# Patient Record
Sex: Female | Born: 1940 | Race: White | Hispanic: No | Smoking: Former smoker
Health system: Southern US, Community
[De-identification: ages and names within clinical notes are randomized; demographics above are authoritative.]

## PROBLEM LIST (undated history)

## (undated) DIAGNOSIS — I10 Essential (primary) hypertension: Secondary | ICD-10-CM

## (undated) DIAGNOSIS — C3490 Malignant neoplasm of unspecified part of unspecified bronchus or lung: Secondary | ICD-10-CM

## (undated) HISTORY — PX: BREAST BIOPSY: SHX20

---

## 2005-03-12 ENCOUNTER — Ambulatory Visit: Payer: Self-pay | Admitting: Family Medicine

## 2006-03-18 ENCOUNTER — Ambulatory Visit: Payer: Self-pay | Admitting: Unknown Physician Specialty

## 2007-04-05 ENCOUNTER — Ambulatory Visit: Payer: Self-pay | Admitting: Unknown Physician Specialty

## 2008-04-06 ENCOUNTER — Ambulatory Visit: Payer: Self-pay | Admitting: Unknown Physician Specialty

## 2009-04-17 ENCOUNTER — Ambulatory Visit: Payer: Self-pay | Admitting: Unknown Physician Specialty

## 2010-05-09 ENCOUNTER — Ambulatory Visit: Payer: Self-pay | Admitting: Unknown Physician Specialty

## 2011-04-23 ENCOUNTER — Ambulatory Visit: Payer: Self-pay | Admitting: Unknown Physician Specialty

## 2011-06-25 ENCOUNTER — Ambulatory Visit: Payer: Self-pay | Admitting: Unknown Physician Specialty

## 2012-04-13 ENCOUNTER — Ambulatory Visit: Payer: Self-pay | Admitting: Family Medicine

## 2012-05-19 ENCOUNTER — Ambulatory Visit: Payer: Self-pay | Admitting: Unknown Physician Specialty

## 2012-06-28 ENCOUNTER — Ambulatory Visit: Payer: Self-pay | Admitting: Unknown Physician Specialty

## 2012-09-26 ENCOUNTER — Ambulatory Visit: Payer: Self-pay

## 2012-10-28 ENCOUNTER — Ambulatory Visit: Payer: Self-pay | Admitting: Family Medicine

## 2013-06-29 ENCOUNTER — Ambulatory Visit: Payer: Self-pay | Admitting: Unknown Physician Specialty

## 2014-07-05 ENCOUNTER — Ambulatory Visit: Payer: Self-pay | Admitting: Unknown Physician Specialty

## 2014-08-26 IMAGING — CT CT ABD-PELV W/ CM
1 of 3 series · 14 of 32 positions shown, 19 images · IV contrast (isovue)
Comparison: None

REASON FOR EXAM: ADD ON LABS 1st  CALL REPORT [id number] abd pain
possible diverticulitis D...
COMMENTS:

PROCEDURE:     CT  - CT ABDOMEN / PELVIS  W  - September 26, 2012 [DATE]
RESULT:     History: Abdominal pain
TECHNIQUE: Multiple axial images of the abdomen and pelvis were performed
from the lung bases to the pubic symphysis, with p.o. contrast and with 100
ml of Isovue 300 intravenous contrast.

[Series 2: 3mm soft tissue · axial · 0.81mm/px · z∈[-248,+151]mm · 14 of 149 slices shown, 19 images]
[im 8/149  soft-tissue]
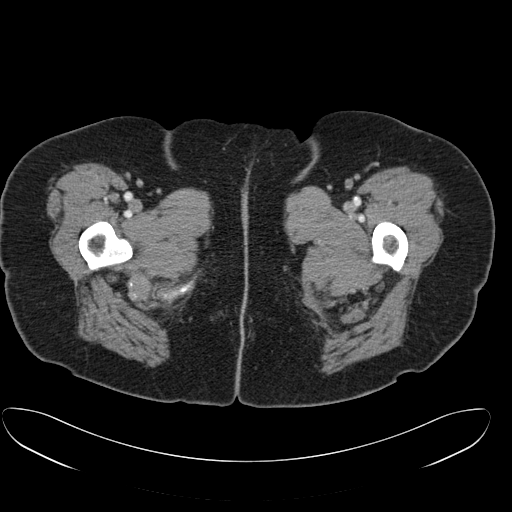
[im 8/149  bone]
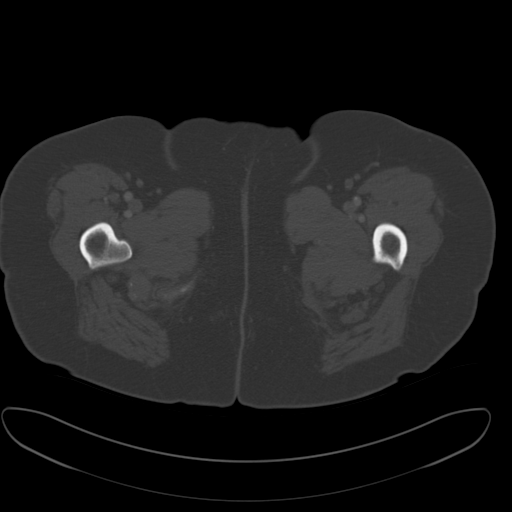
[im 23/149  soft-tissue]
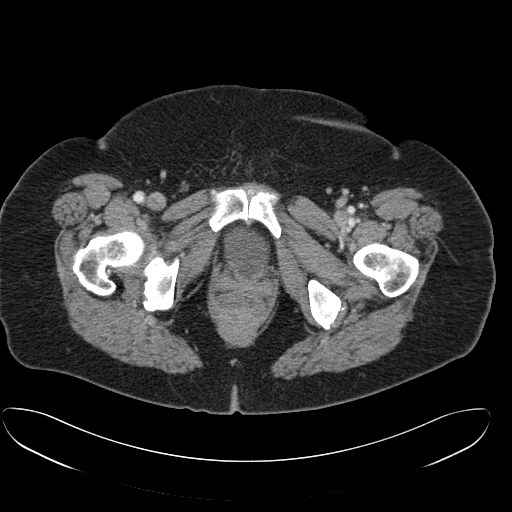
[im 30/149  soft-tissue]
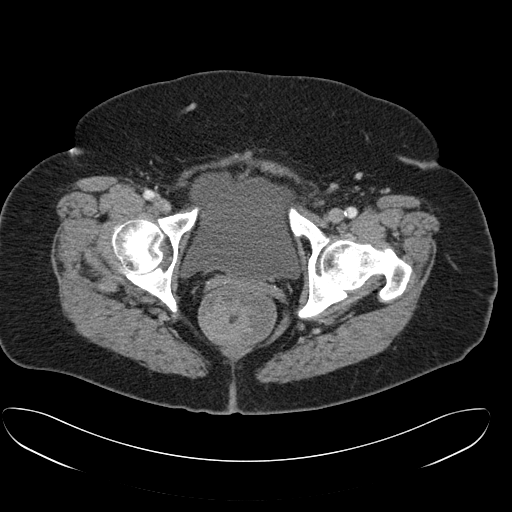
[im 45/149  soft-tissue]
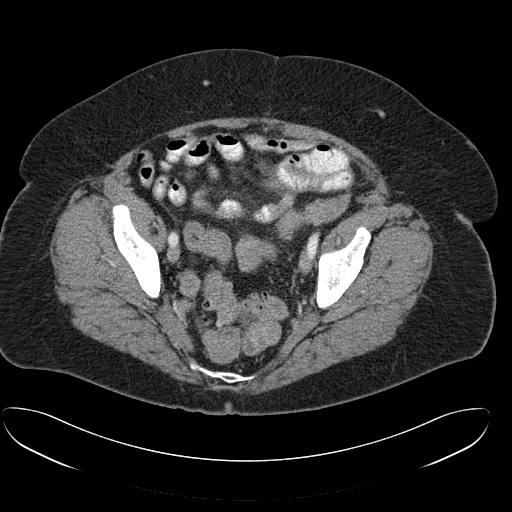
[im 52/149  soft-tissue]
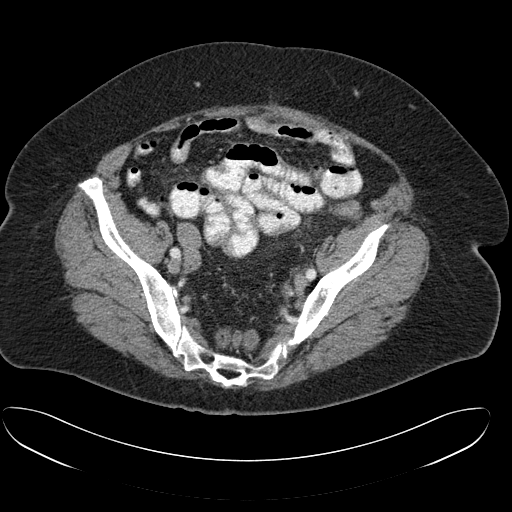
[im 67/149  soft-tissue]
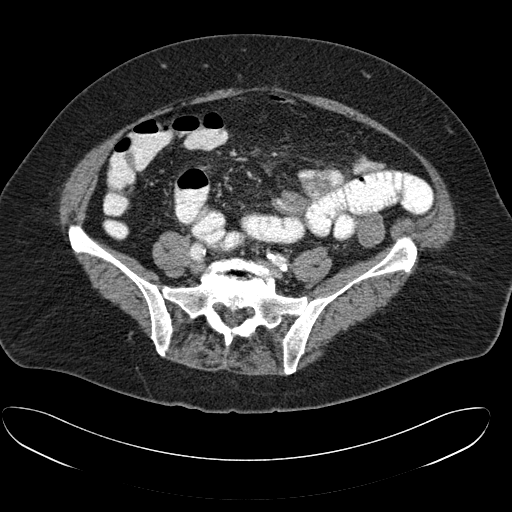
[im 75/149  soft-tissue]
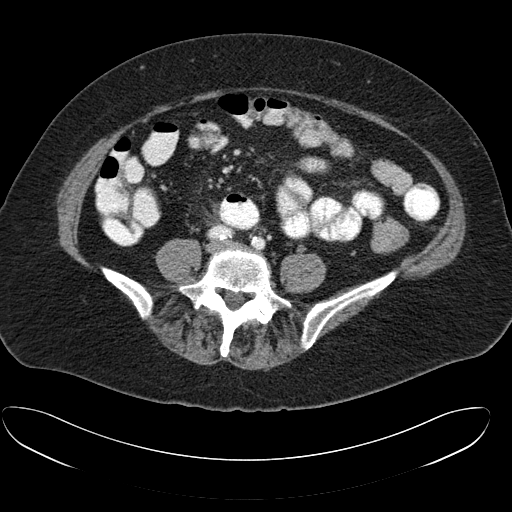
[im 82/149  soft-tissue]
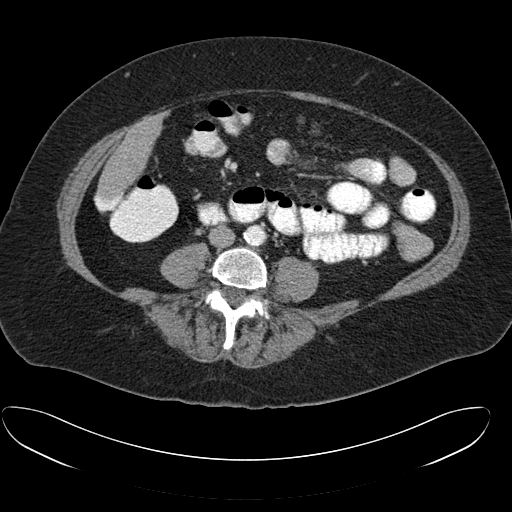
[im 97/149  soft-tissue]
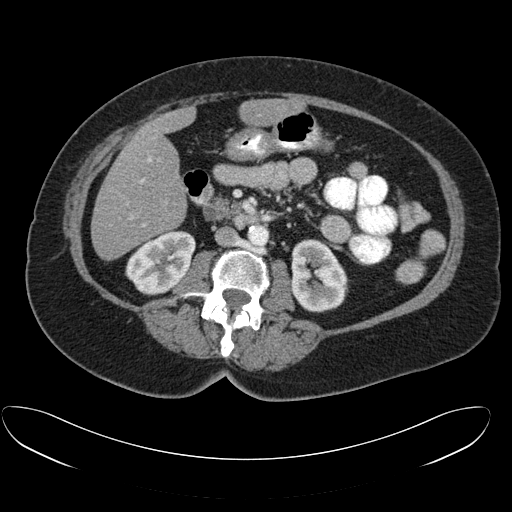
[im 97/149  bone]
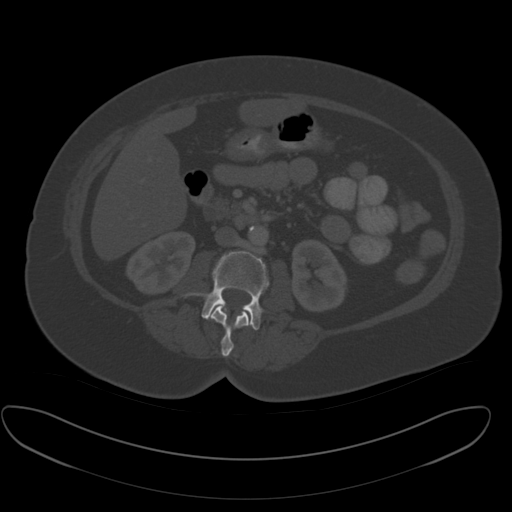
[im 104/149  soft-tissue]
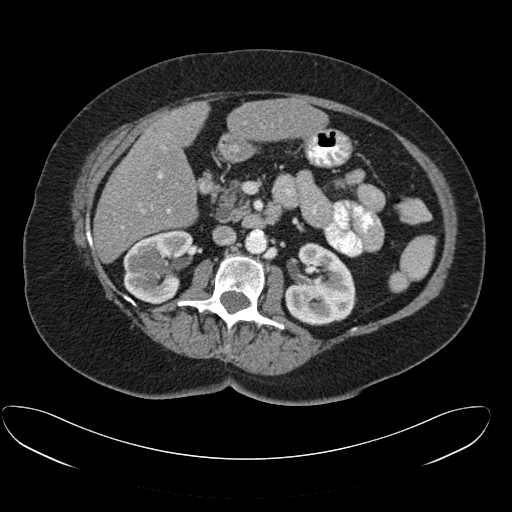
[im 119/149  soft-tissue]
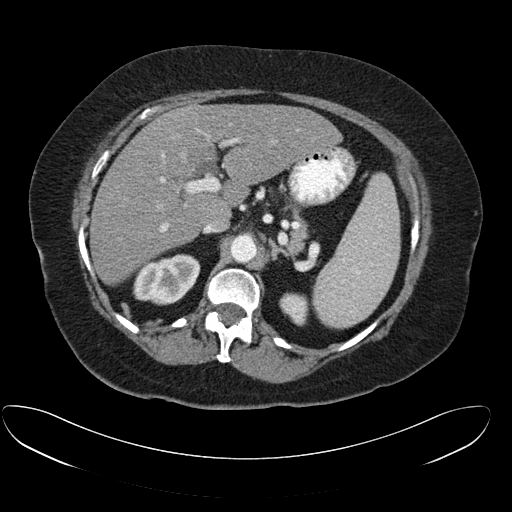
[im 119/149  lung]
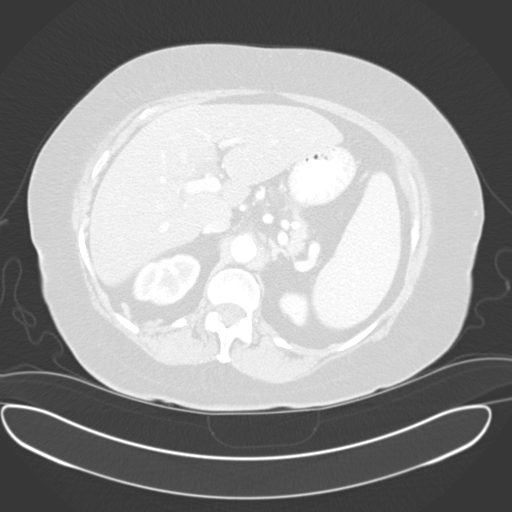
[im 126/149  soft-tissue]
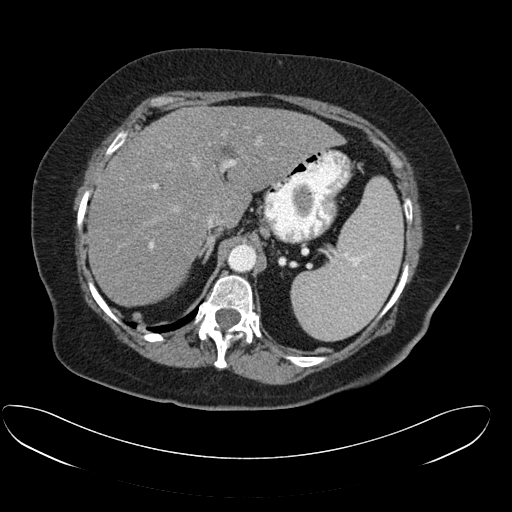
[im 126/149  lung]
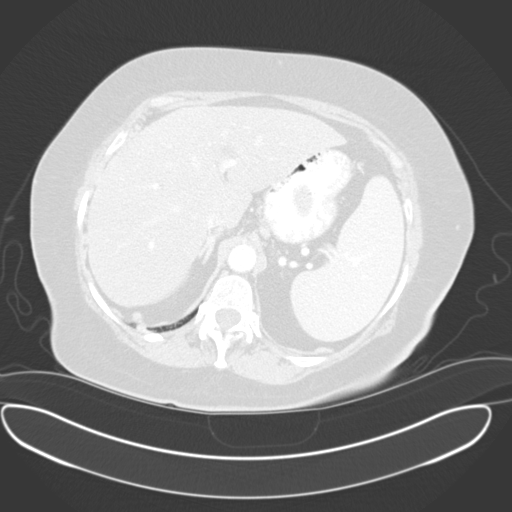
[im 134/149  lung]
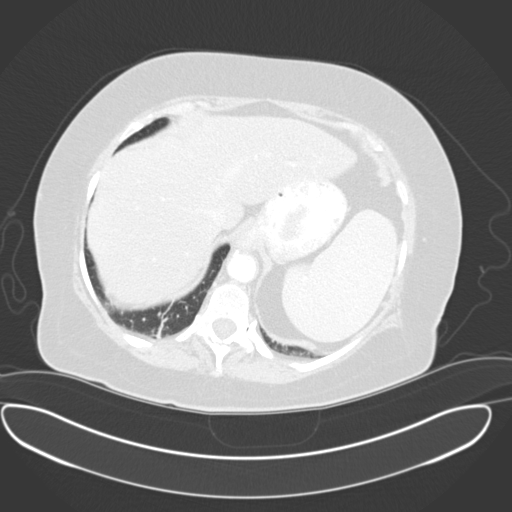
[im 141/149  soft-tissue]
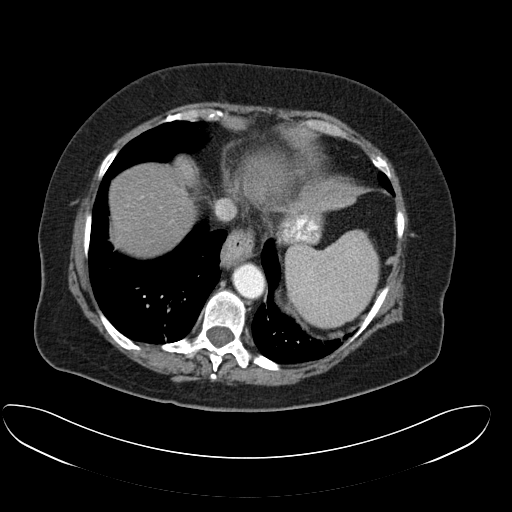
[im 141/149  lung]
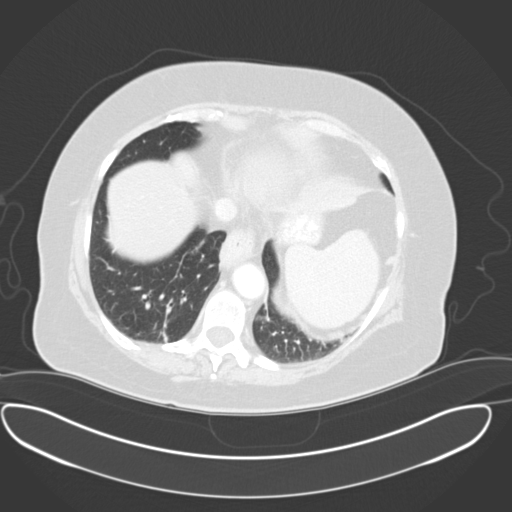

[14 of 32 positions shown; findings below may reference images not displayed]

FINDINGS: The lung bases are clear. There is no pneumothorax. The heart size is
normal.

The liver demonstrates no focal abnormality. There is no intrahepatic or
extrahepatic biliary ductal dilatation. The gallbladder is unremarkable. The
spleen demonstrates no focal abnormality. There is a 3.2 cm right renal
interpolar mass which is of indeterminate etiology measuring 30 Hounsfield
units. The left kidney, adrenal glands, and pancreas are normal. The bladder
is unremarkable.

There is generalized haziness within the mesenteric fat. There are a few
loops of small bowel with bowel wall thickening and perienteric inflammatory
changes concerning for enteritis secondary to an infectious or inflammatory
etiology. There is no bowel obstruction. There is no pneumoperitoneum,
pneumatosis, or portal venous gas. There is no abdominal or pelvic free
fluid. There is no lymphadenopathy.

The abdominal aorta is normal in caliber with atherosclerosis.

The osseous structures are unremarkable.
IMPRESSION: 1. Enteritis centered in the mid abdomen which may be secondary to an
infectious or inflammatory etiology. There is no drainable fluid collection.

2. Indeterminate 3.2 cm right renal mass. Recommend dedicated renal mass
protocol CT.

[REDACTED]

## 2015-08-08 ENCOUNTER — Other Ambulatory Visit: Payer: Self-pay | Admitting: Unknown Physician Specialty

## 2015-08-08 DIAGNOSIS — Z1231 Encounter for screening mammogram for malignant neoplasm of breast: Secondary | ICD-10-CM

## 2015-08-14 ENCOUNTER — Other Ambulatory Visit: Payer: Self-pay | Admitting: Unknown Physician Specialty

## 2015-08-14 ENCOUNTER — Ambulatory Visit
Admission: RE | Admit: 2015-08-14 | Discharge: 2015-08-14 | Disposition: A | Discharge status: Home / Self Care | Payer: Medicare Other | Source: Ambulatory Visit | Attending: Unknown Physician Specialty | Admitting: Unknown Physician Specialty

## 2015-08-14 DIAGNOSIS — Z1231 Encounter for screening mammogram for malignant neoplasm of breast: Secondary | ICD-10-CM

## 2015-09-25 DIAGNOSIS — C3490 Malignant neoplasm of unspecified part of unspecified bronchus or lung: Secondary | ICD-10-CM

## 2015-09-25 HISTORY — DX: Malignant neoplasm of unspecified part of unspecified bronchus or lung: C34.90

## 2016-07-06 ENCOUNTER — Other Ambulatory Visit: Payer: Self-pay | Admitting: Unknown Physician Specialty

## 2016-07-06 DIAGNOSIS — Z1231 Encounter for screening mammogram for malignant neoplasm of breast: Secondary | ICD-10-CM

## 2016-08-17 ENCOUNTER — Ambulatory Visit
Admission: RE | Admit: 2016-08-17 | Discharge: 2016-08-17 | Disposition: A | Discharge status: Home / Self Care | Payer: Medicare Other | Source: Ambulatory Visit | Attending: Unknown Physician Specialty | Admitting: Unknown Physician Specialty

## 2016-08-17 DIAGNOSIS — Z1231 Encounter for screening mammogram for malignant neoplasm of breast: Secondary | ICD-10-CM

## 2016-08-17 HISTORY — DX: Malignant neoplasm of unspecified part of unspecified bronchus or lung: C34.90

## 2017-08-04 ENCOUNTER — Other Ambulatory Visit: Payer: Self-pay | Admitting: Unknown Physician Specialty

## 2017-08-04 DIAGNOSIS — Z1231 Encounter for screening mammogram for malignant neoplasm of breast: Secondary | ICD-10-CM

## 2017-08-18 ENCOUNTER — Other Ambulatory Visit: Payer: Self-pay

## 2017-08-18 ENCOUNTER — Encounter: Payer: Self-pay | Admitting: Emergency Medicine

## 2017-08-18 ENCOUNTER — Emergency Department: Payer: Medicare Other

## 2017-08-18 ENCOUNTER — Emergency Department
Admission: EM | Admit: 2017-08-18 | Discharge: 2017-08-18 | Disposition: A | Discharge status: Home / Self Care | Payer: Medicare Other | Attending: Emergency Medicine | Admitting: Emergency Medicine

## 2017-08-18 DIAGNOSIS — Z87891 Personal history of nicotine dependence: Secondary | ICD-10-CM | POA: Diagnosis not present

## 2017-08-18 DIAGNOSIS — I10 Essential (primary) hypertension: Secondary | ICD-10-CM | POA: Diagnosis not present

## 2017-08-18 HISTORY — DX: Essential (primary) hypertension: I10

## 2017-08-18 LAB — COMPREHENSIVE METABOLIC PANEL
ALK PHOS: 60 U/L (ref 38–126)
ALT: 15 U/L (ref 14–54)
AST: 27 U/L (ref 15–41)
Albumin: 4.1 g/dL (ref 3.5–5.0)
Anion gap: 7 (ref 5–15)
BILIRUBIN TOTAL: 1.2 mg/dL (ref 0.3–1.2)
BUN: 14 mg/dL (ref 6–20)
CALCIUM: 10.2 mg/dL (ref 8.9–10.3)
CO2: 30 mmol/L (ref 22–32)
CREATININE: 0.72 mg/dL (ref 0.44–1.00)
Chloride: 103 mmol/L (ref 101–111)
GFR calc Af Amer: >60 mL/min (ref >60)
Glucose, Bld: 109 mg/dL — ABNORMAL HIGH (ref 65–99)
Potassium: 3.4 mmol/L — ABNORMAL LOW (ref 3.5–5.1)
Sodium: 140 mmol/L (ref 135–145)
TOTAL PROTEIN: 7 g/dL (ref 6.5–8.1)

## 2017-08-18 LAB — CBC WITH DIFFERENTIAL/PLATELET
BASOS ABS: 0.1 10*3/uL (ref 0–0.1)
Basophils Relative: 1 %
Eosinophils Absolute: 0.1 10*3/uL (ref 0–0.7)
Eosinophils Relative: 2 %
HEMATOCRIT: 38.3 % (ref 35.0–47.0)
Hemoglobin: 12.9 g/dL (ref 12.0–16.0)
LYMPHS ABS: 0.8 10*3/uL — AB (ref 1.0–3.6)
LYMPHS PCT: 16 %
MCH: 29.4 pg (ref 26.0–34.0)
MCHC: 33.7 g/dL (ref 32.0–36.0)
MCV: 87.1 fL (ref 80.0–100.0)
Monocytes Absolute: 0.5 10*3/uL (ref 0.2–0.9)
Monocytes Relative: 10 %
NEUTROS ABS: 3.6 10*3/uL (ref 1.4–6.5)
Neutrophils Relative %: 71 %
Platelets: 168 10*3/uL (ref 150–440)
RBC: 4.4 MIL/uL (ref 3.80–5.20)
RDW: 13.5 % (ref 11.5–14.5)
WBC: 5 10*3/uL (ref 3.6–11.0)

## 2017-08-18 LAB — TROPONIN I

## 2017-08-18 LAB — MAGNESIUM: Magnesium: 2 mg/dL (ref 1.7–2.4)

## 2017-08-18 MED ORDER — BENZONATATE 100 MG PO CAPS
100.0000 mg | ORAL_CAPSULE | Freq: Once | ORAL | Status: AC
  Administered 2017-08-18: 100 mg via ORAL
  Filled 2017-08-18: qty 1

## 2017-08-18 NOTE — ED Triage Notes (Signed)
Patient to ER from home via [facility] for c/o HTN. States she woke up and had mild headache and called EMS after noting systolic over 848 at home. Pt denies any chest pain, shortness of breath, fevers or dizziness. Denies any recent stressors or change in medications. Patient has had mild cough that patient describes as dry cough last couple of days (since Sunday). Patient's initial BP for EMS was 350/75, next systolic was 732, final BP was 197/105. Patient alert and oriented and speaking in clear sentences without any difficulty or distress.

## 2017-08-18 NOTE — ED Provider Notes (Signed)
[facility] Emergency Department Provider Note  ____________________________________________   First MD Initiated Contact with Patient 08/18/17 [phone]     (approximate)  I have reviewed the triage vital signs and the nursing notes.   HISTORY  Chief Complaint Hypertension    HPI Ann Mejia is a 77 y.o. female who presents by EMS for evaluation of elevated blood pressure.  She awoke and did not feel well in general but without any specific symptoms.  She is associated with high blood pressure so she checked her blood pressure at home and found that the systolic blood pressure was greater than 200.  She is very concerned about her blood pressure and is on medication for it and when she had such an elevated pressure she called 911.  The blood pressures were equally elevated.  She feels well here in the emergency department and her blood pressure has come down slightly although it is still elevated both systolic and diastolic.  She has been in her usual state of health other than a persistent cough over the last few days.  She has a history of lung cancer and what she describes as "a little touch of COPD".  She says that her blood pressure is frequently elevated but she is concerned she is going to have a stroke which is what prompted her to come to the emergency department.  Currently she denies any symptoms including headache, visual changes, chest pain, shortness of breath, nausea, vomiting, and abdominal pain.  Past Medical History:  Diagnosis Date  . Hypertension   . Lung cancer ([place]) 09/25/2015    There are no active problems to display for this patient.   Past Surgical History:  Procedure Laterality Date  . BREAST BIOPSY Left    benign    Prior to Admission medications   Not on File    Allergies Amlodipine; Aspirin; Augmentin [amoxicillin-pot clavulanate]; Codeine; Doxycycline; Hydrochlorothiazide; and Metronidazole  Family History    Problem Relation Age of Onset  . Breast cancer Mother   . Breast cancer Sister     Social History Social History   Tobacco Use  . Smoking status: Former Research scientist (life sciences)  . Smokeless tobacco: Never Used  Substance Use Topics  . Alcohol use: Not on file  . Drug use: Not on file    Review of Systems Constitutional: No fever/chills.  General malaise associated with hypertension, now resolved Eyes: No visual changes. ENT: No sore throat. Cardiovascular: Denies chest pain. Respiratory: Recent cough .denies shortness of breath. Gastrointestinal: No abdominal pain.  No nausea, no vomiting.  No diarrhea.  No constipation. Genitourinary: Negative for dysuria. Musculoskeletal: Negative for neck pain.  Negative for back pain. Integumentary: Negative for rash. Neurological: Negative for headaches, focal weakness or numbness.   ____________________________________________   PHYSICAL EXAM:  VITAL SIGNS: ED Triage Vitals  Enc Vitals Group     BP 08/18/17 0345 (!) 188/74     Pulse Rate 08/18/17 0345 75     Resp 08/18/17 0345 20     Temp 08/18/17 0345 98.6 F (37 C)     Temp Source 08/18/17 0345 Oral     SpO2 08/18/17 0340 93 %     Weight 08/18/17 0346 81.6 kg (180 lb)     Height 08/18/17 0346 1.575 m (5\' 2" )     Head Circumference --      Peak Flow --      Pain Score 08/18/17 0346 2     Pain Loc --  Pain Edu? --      Excl. in [place]? --     Constitutional: Alert and oriented. Well appearing and in no acute distress. Eyes: Conjunctivae are normal.  Head: Atraumatic. Nose: No congestion/rhinnorhea. Mouth/Throat: Mucous membranes are moist. Neck: No stridor.  No meningeal signs.   Cardiovascular: Normal rate, regular rhythm. Good peripheral circulation. Grossly normal heart sounds. Respiratory: Normal respiratory effort.  No retractions. Lungs CTAB. Gastrointestinal: Soft and nontender. No distention.  Musculoskeletal: No lower extremity tenderness nor edema. No gross deformities  of extremities. Neurologic:  Normal speech and language. No gross focal neurologic deficits are appreciated.  Skin:  Skin is warm, dry and intact. No rash noted. Psychiatric: Mood and affect are normal. Speech and behavior are normal.  ____________________________________________   LABS (all labs ordered are listed, but only abnormal results are displayed)  Labs Reviewed  CBC WITH DIFFERENTIAL/PLATELET - Abnormal; Notable for the following components:      Result Value   Lymphs Abs 0.8 (*)    All other components within normal limits  COMPREHENSIVE METABOLIC PANEL - Abnormal; Notable for the following components:   Potassium 3.4 (*)    Glucose, Bld 109 (*)    All other components within normal limits  TROPONIN I  MAGNESIUM   ____________________________________________  EKG  ED ECG REPORT I, Hinda Kehr, the attending physician, personally viewed and interpreted this ECG.  Date: 08/18/2017 EKG Time: 03:25 Rate: 73 Rhythm: normal sinus rhythm QRS Axis: normal Intervals: normal ST/T Wave abnormalities: normal Narrative Interpretation: no evidence of acute ischemia  ____________________________________________  RADIOLOGY   Dg Chest 2 View  Result Date: 08/18/2017 CLINICAL DATA:  Cough since Sunday, history of lung cancer EXAM: CHEST  2 VIEW COMPARISON:  None. FINDINGS: Volume loss on the left. Coarse interstitial opacity bilaterally, likely due to chronic bronchitic change. No acute consolidation. Small left pleural effusion. Mild cardiomegaly with aortic atherosclerosis. No pneumothorax. IMPRESSION: 1. Volume loss on the left with small left pleural effusion or thickening 2. No acute pulmonary infiltrate 3. Mild cardiomegaly Electronically Signed   By: Donavan Foil M.D.   On: 08/18/2017 04:04    ____________________________________________   PROCEDURES  Critical Care performed: No   Procedure(s) performed:    Procedures   ____________________________________________   INITIAL IMPRESSION / ASSESSMENT AND PLAN / ED COURSE  As part of my medical decision making, I reviewed the following data within the [facility] notes reviewed and incorporated, Labs reviewed  and EKG interpreted   Differential diagnosis includes, but is not limited to, hypertensive urgency/emergency, ACS, CVA, infectious process such as pneumonia.   Patient is well-appearing and in no acute distress.  She is sharp and communicative and knows her medical history quite well.  She felt reassured that her blood pressure is coming down without intervention.  I had my usual asymptomatic hypertension discussion with the patient and she agrees with the plan.  Her lab work is all reassuring with no evidence of endorgan dysfunction and her chest Swilling and EKG both do not show any acute findings.  She is comfortable with the plan for discharge and outpatient follow-up with no medication changes.  I gave my usual and customary return precautions.     ____________________________________________  FINAL CLINICAL IMPRESSION(S) / ED DIAGNOSES  Final diagnoses:  Essential hypertension     MEDICATIONS GIVEN DURING THIS VISIT:  Medications - No data to display   ED Discharge Orders    None  Note:  This document was prepared using Dragon voice recognition software and may include unintentional dictation errors.    Hinda Kehr, MD 08/18/17 [phone]

## 2017-08-18 NOTE — Discharge Instructions (Signed)
As we discussed, though you do have high blood pressure (hypertension), fortunately it is not immediately dangerous at this time and does not need emergency intervention or admission to the hospital.  If we add to or change your regular medications, we may cause more harm than good - it is more appropriate for your primary care doctor to evaluate you in clinic and decide if any medication changes are needed.  Please follow up in clinic as recommended in these papers.    Return to the Emergency Department (ED) if you experience any worsening chest pain/pressure/tightness, difficulty breathing, or sudden sweating, or other symptoms that concern you.

## 2017-08-18 NOTE — ED Notes (Signed)
Patient given a cup of coffee.

## 2017-08-31 ENCOUNTER — Inpatient Hospital Stay: Admission: RE | Admit: 2017-08-31 | Payer: Medicare Other | Source: Ambulatory Visit

## 2017-10-05 ENCOUNTER — Ambulatory Visit
Admission: RE | Admit: 2017-10-05 | Discharge: 2017-10-05 | Disposition: A | Discharge status: Home / Self Care | Payer: Medicare Other | Source: Ambulatory Visit | Attending: Unknown Physician Specialty | Admitting: Unknown Physician Specialty

## 2017-10-05 DIAGNOSIS — Z1231 Encounter for screening mammogram for malignant neoplasm of breast: Secondary | ICD-10-CM | POA: Insufficient documentation

## 2018-07-27 DEATH — deceased

## 2019-07-18 IMAGING — CR DG CHEST 2V
2 series · 2 of 2 positions shown · non-contrast
Comparison: None.

CLINICAL DATA: Cough since [REDACTED], history of lung cancer

EXAM:
CHEST  2 VIEW

[chest pa]
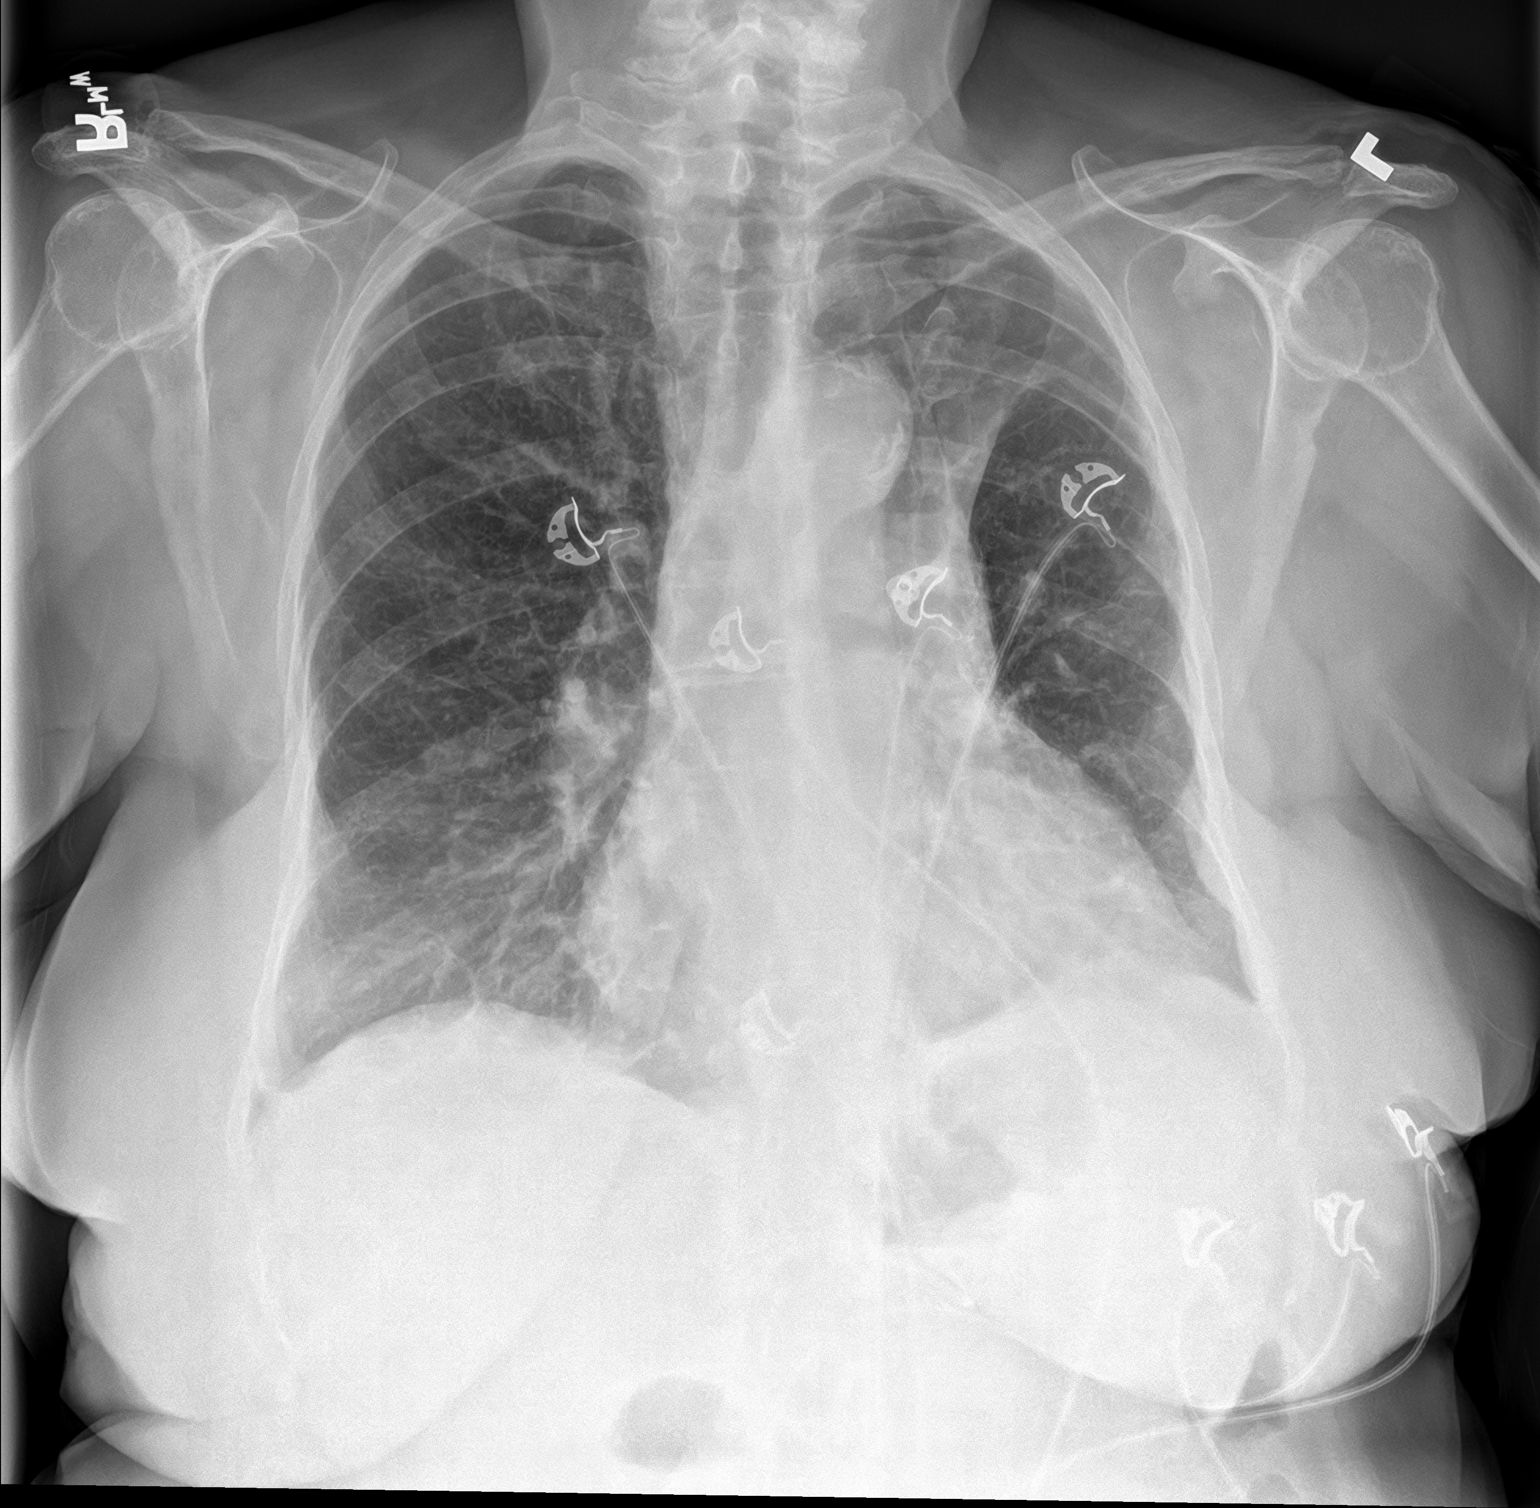

[chest lat]
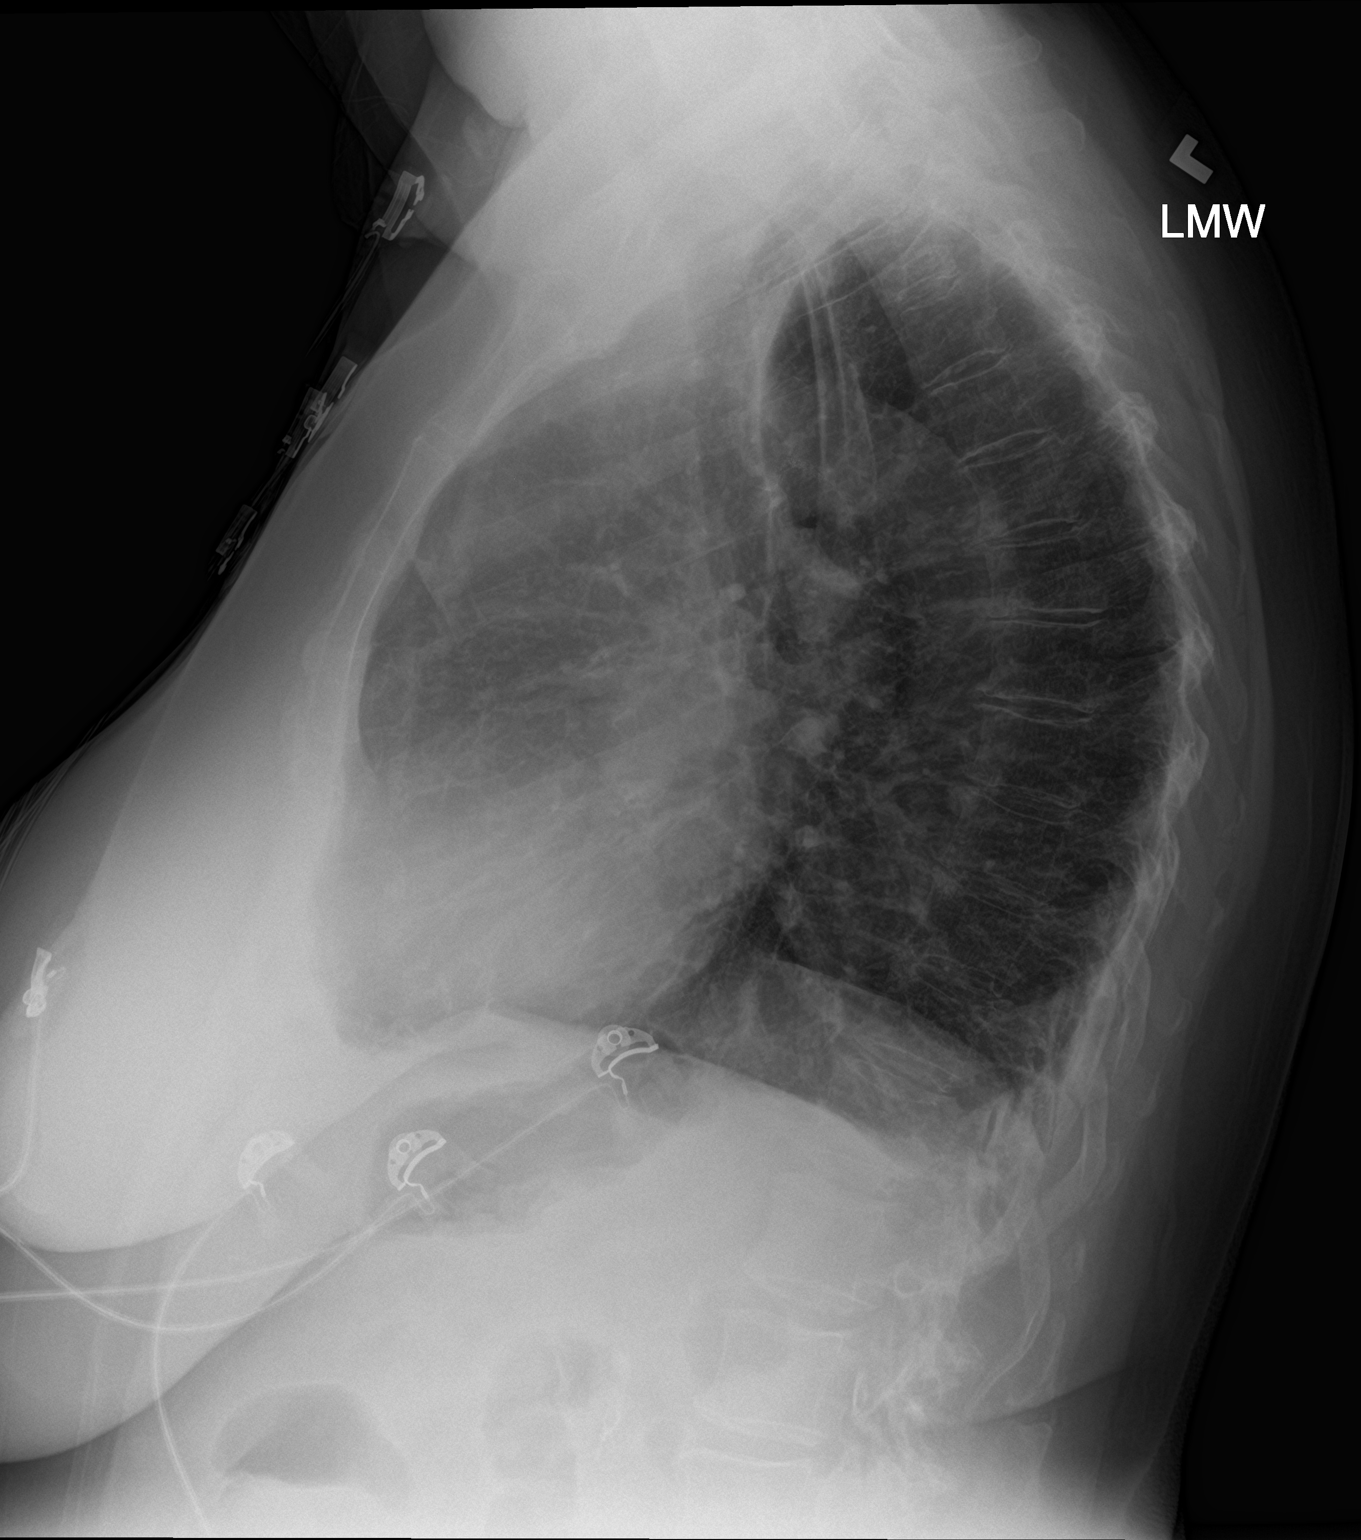

[2 of 2 positions shown; findings below may reference images not displayed]

FINDINGS: Volume loss on the left. Coarse interstitial opacity bilaterally,
likely due to chronic bronchitic change. No acute consolidation.
Small left pleural effusion. Mild cardiomegaly with aortic
atherosclerosis. No pneumothorax.
IMPRESSION: 1. Volume loss on the left with small left pleural effusion or
thickening
2. No acute pulmonary infiltrate
3. Mild cardiomegaly
